# Patient Record
Sex: Female | Born: 1970 | Race: Black or African American | Hispanic: No | Smoking: Former smoker
Health system: Southern US, Community
[De-identification: ages and names within clinical notes are randomized; demographics above are authoritative.]

## PROBLEM LIST (undated history)

## (undated) DIAGNOSIS — IMO0002 Reserved for concepts with insufficient information to code with codable children: Secondary | ICD-10-CM

## (undated) DIAGNOSIS — I2699 Other pulmonary embolism without acute cor pulmonale: Secondary | ICD-10-CM

## (undated) DIAGNOSIS — M329 Systemic lupus erythematosus, unspecified: Secondary | ICD-10-CM

## (undated) HISTORY — PX: ECTOPIC PREGNANCY SURGERY: SHX613

---

## 2005-01-30 ENCOUNTER — Emergency Department (HOSPITAL_COMMUNITY): Admission: EM | Admit: 2005-01-30 | Discharge: 2005-01-30 | Payer: Self-pay | Admitting: Emergency Medicine

## 2005-01-31 ENCOUNTER — Ambulatory Visit: Payer: Self-pay | Admitting: Family Medicine

## 2005-08-08 ENCOUNTER — Emergency Department (HOSPITAL_COMMUNITY): Admission: EM | Admit: 2005-08-08 | Discharge: 2005-08-08 | Payer: Self-pay | Admitting: Emergency Medicine

## 2005-08-15 ENCOUNTER — Emergency Department (HOSPITAL_COMMUNITY): Admission: EM | Admit: 2005-08-15 | Discharge: 2005-08-15 | Payer: Self-pay | Admitting: Family Medicine

## 2008-09-22 ENCOUNTER — Inpatient Hospital Stay (HOSPITAL_COMMUNITY): Admission: EM | Admit: 2008-09-22 | Discharge: 2008-09-29 | Payer: Self-pay | Admitting: Emergency Medicine

## 2008-09-22 ENCOUNTER — Encounter (INDEPENDENT_AMBULATORY_CARE_PROVIDER_SITE_OTHER): Payer: Self-pay | Admitting: Internal Medicine

## 2008-09-22 ENCOUNTER — Ambulatory Visit: Payer: Self-pay | Admitting: Vascular Surgery

## 2010-10-20 IMAGING — CR DG CHEST 2V
2 series · 2 of 2 positions shown · non-contrast
Comparison: 01/30/2005.

CLINICAL DATA: 37-year-old female with chest pain more on the right
side.  Coughing up blood.

CHEST - 2 VIEW

[w chest pa]
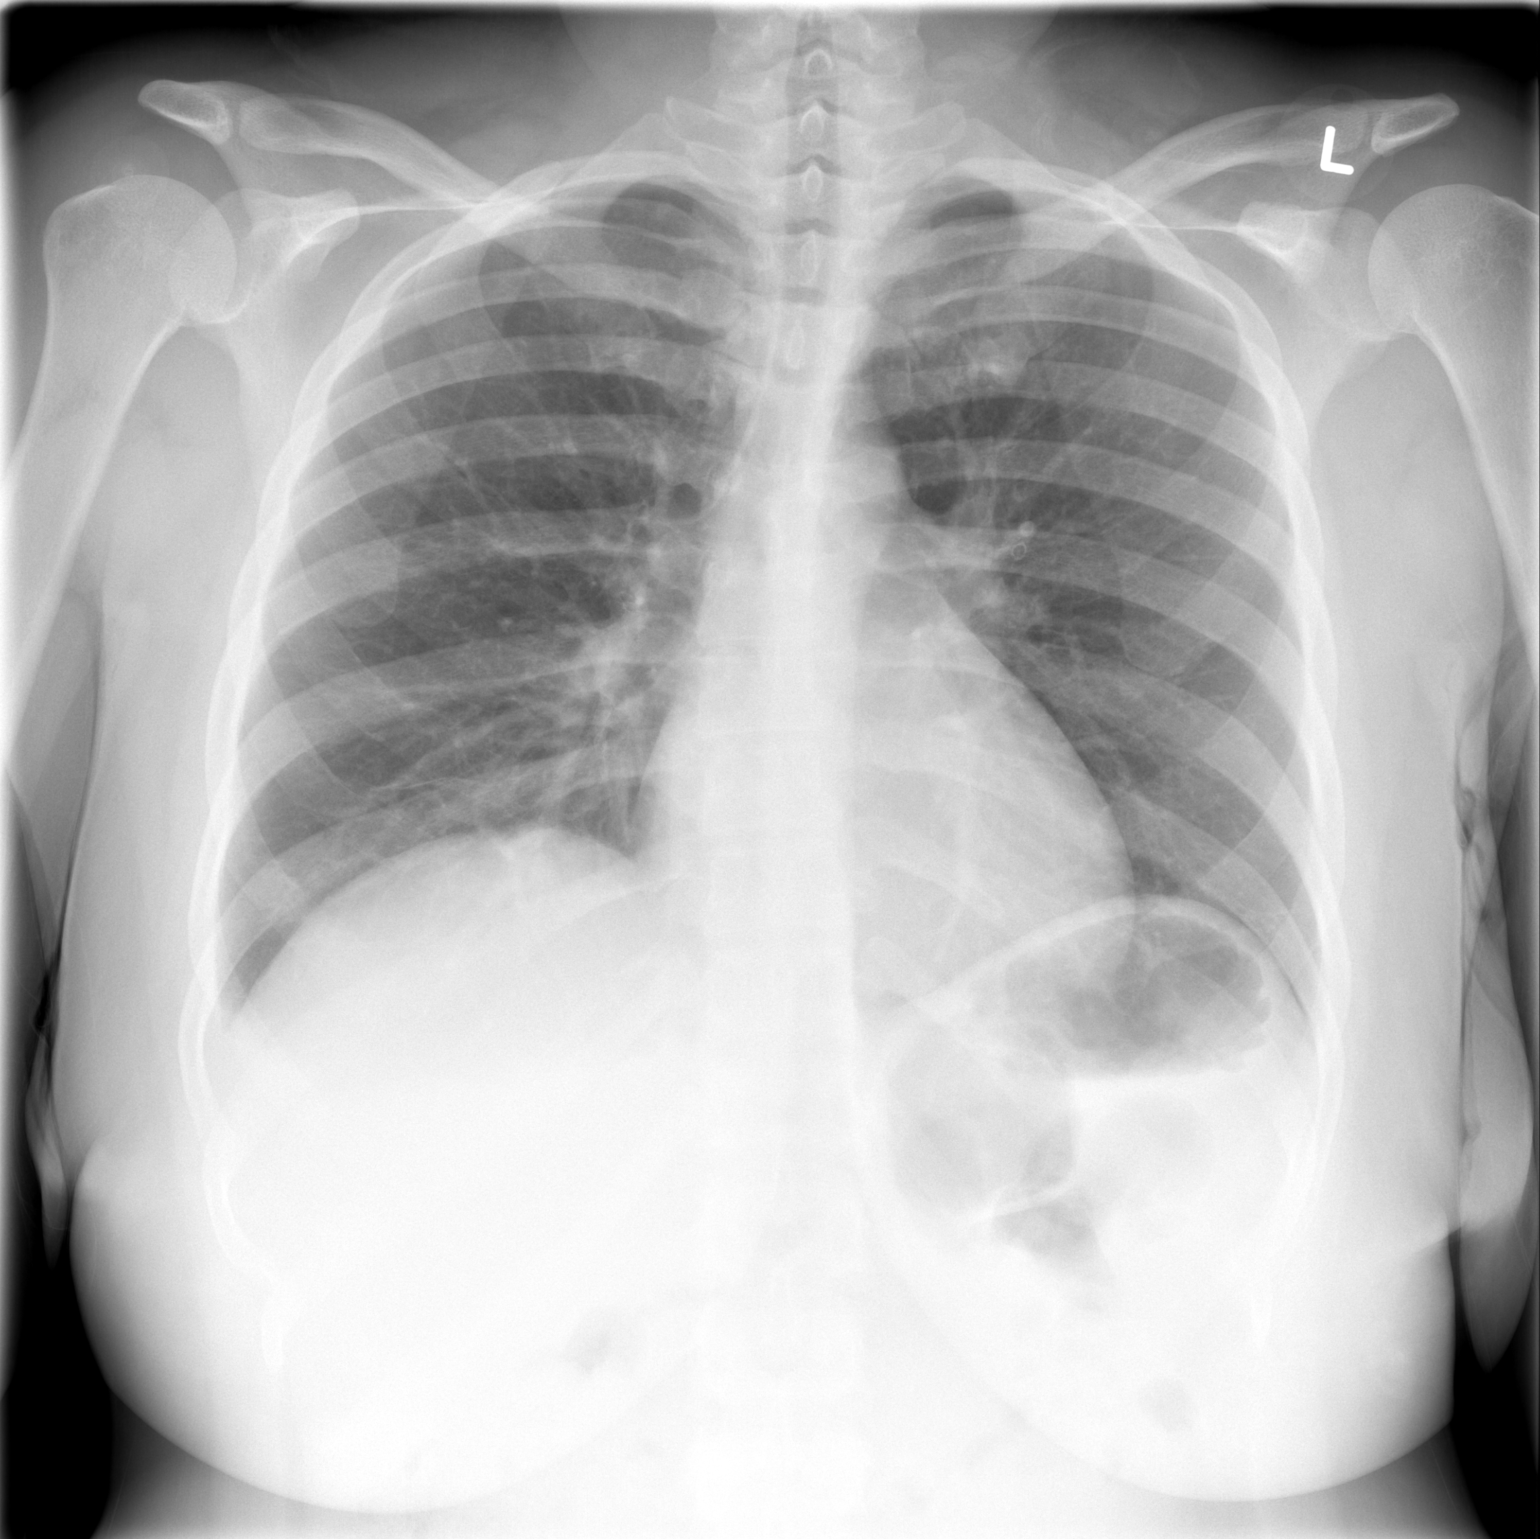

[w chest lat]
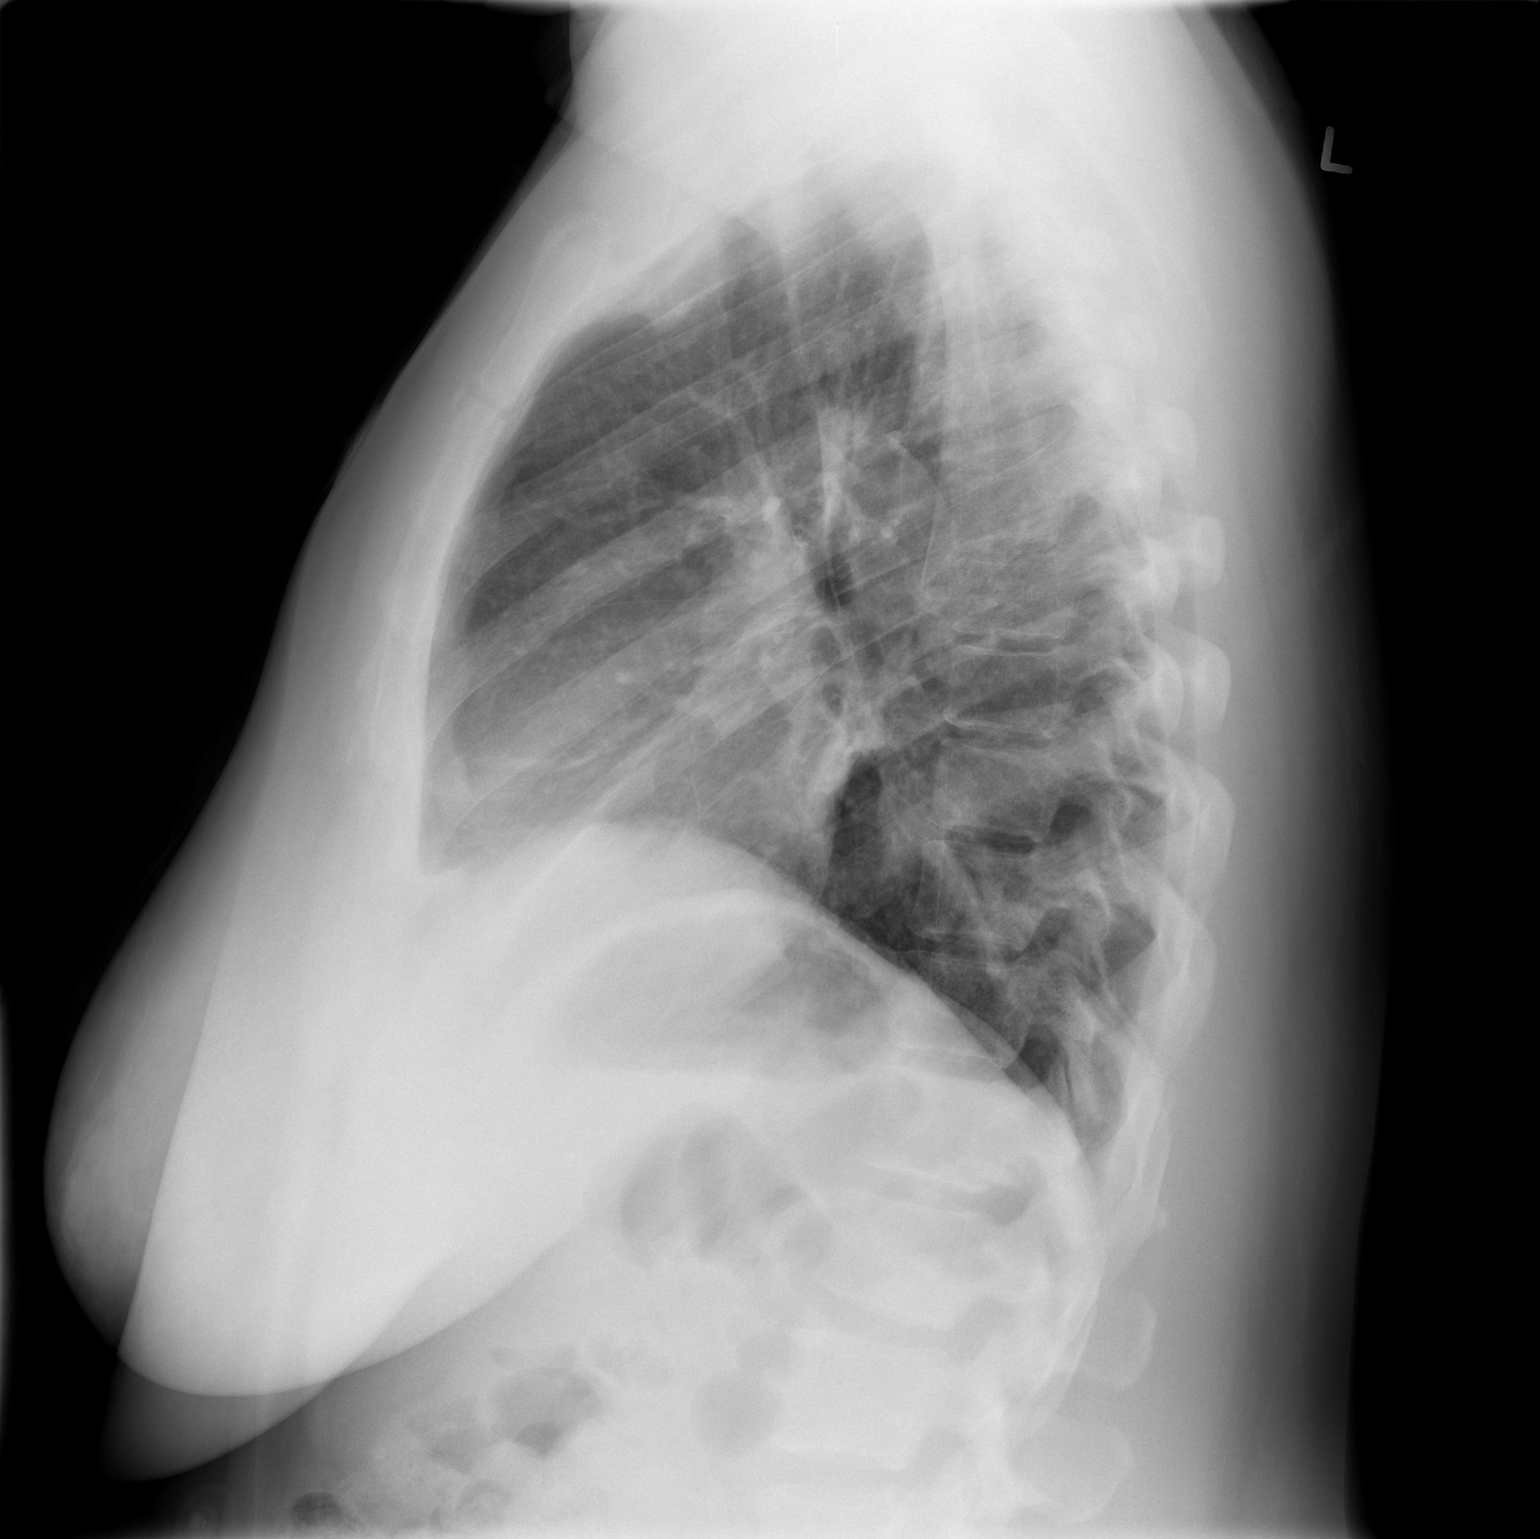

[2 of 2 positions shown; findings below may reference images not displayed]

FINDINGS: Chronic elevation of the right hemidiaphragm is
unchanged.  There is mild right middle lobe streaky opacity which
also to the degree of chronic but mildly increased since 3225.  The
left lung is clear.  Cardiac size and mediastinal contours are
within normal limits.  No pneumothorax, pulmonary edema, pleural
effusion or consolidation. Tracheal air column is within normal
limits.  No acute osseous abnormality identified.
IMPRESSION: Mild acute on chronic streaky right middle lobe opacity could
reflect atelectasis due to the chronic right hemidiaphragm
elevation.  Developing infection or other infiltrate are not
excluded.

## 2010-10-20 IMAGING — CT CT ANGIO CHEST
3 of 8 series · 18 of 36 positions shown · IV contrast (APPLIED)
Comparison: Chest radiograph from earlier the same day.

CLINICAL DATA: 37-year-old female with chest pain and left lower
extremity swelling.

CT ANGIOGRAPHY CHEST
TECHNIQUE: Multidetector CT imaging of the chest using the
standard protocol during bolus administration of intravenous
contrast. Multiplanar reconstructed images including MIPs were
obtained and reviewed to evaluate the vascular anatomy.
Contrast: 186 ml Omnipaque 350.

[Series 6: pulm embolism 3.0 b60f lung · axial · 0.72mm/px · z∈[-194,-86]mm · 3 of 73 slices shown]
[im 19/73  mediastinal]
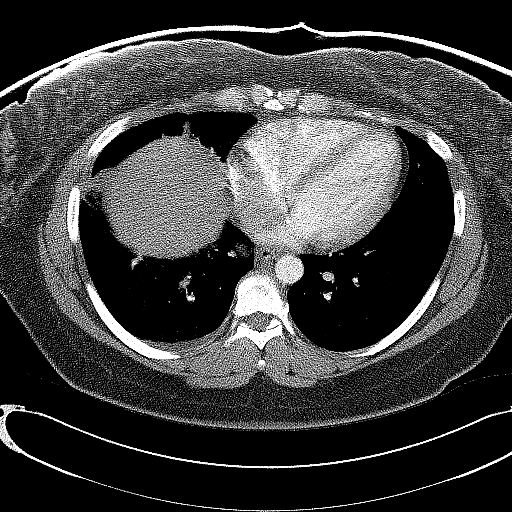
[im 37/73  mediastinal]
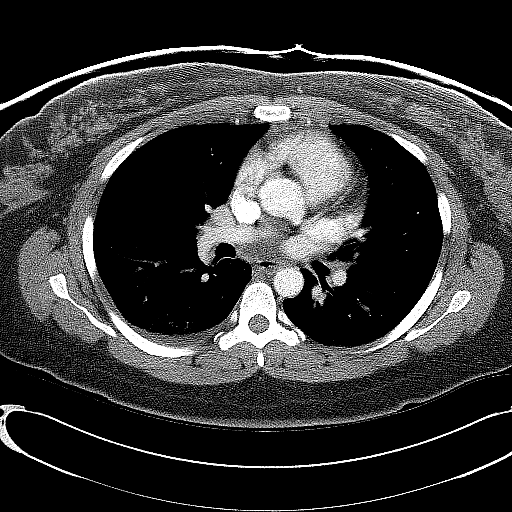
[im 55/73  mediastinal]
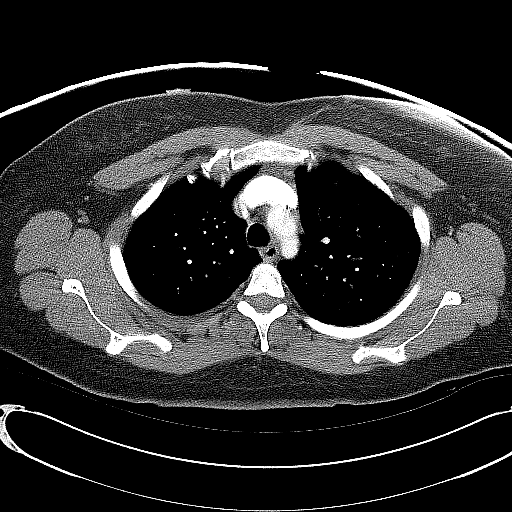

[Series 9: pulm embolism 1.0 b25f thins · axial · 0.72mm/px · z∈[-250,-48]mm · 14 of 233 slices shown]
[im 16/233  lung]
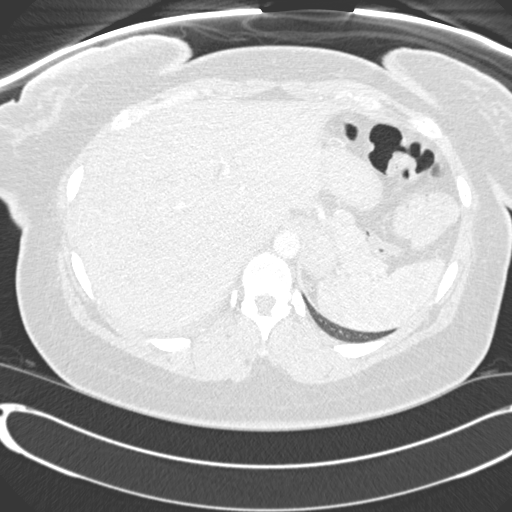
[im 31/233  mediastinal]
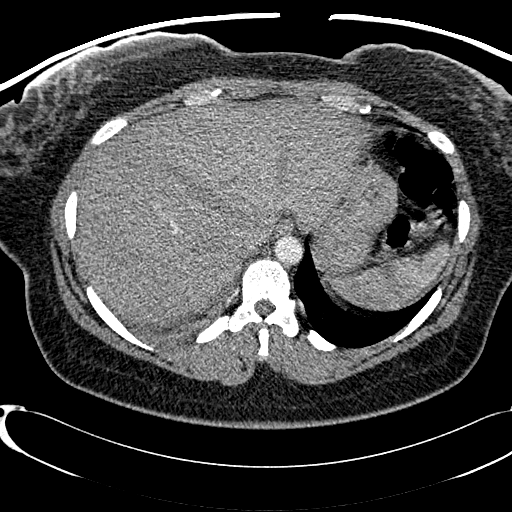
[im 47/233  lung]
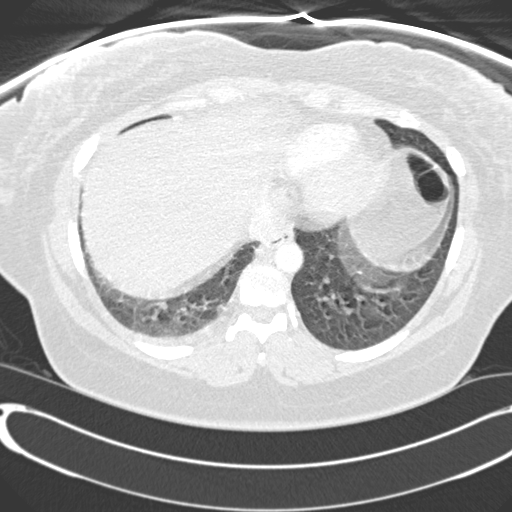
[im 62/233  mediastinal]
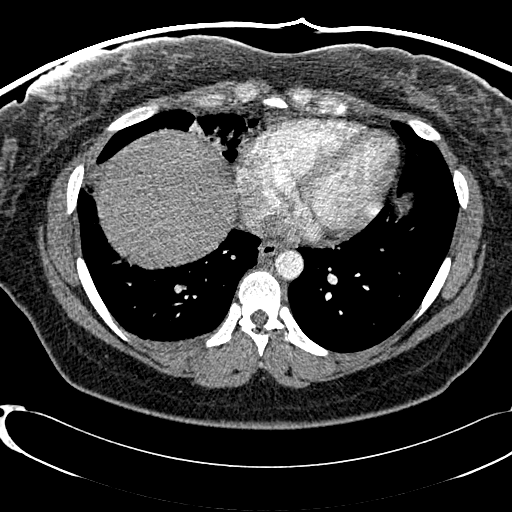
[im 78/233  lung]
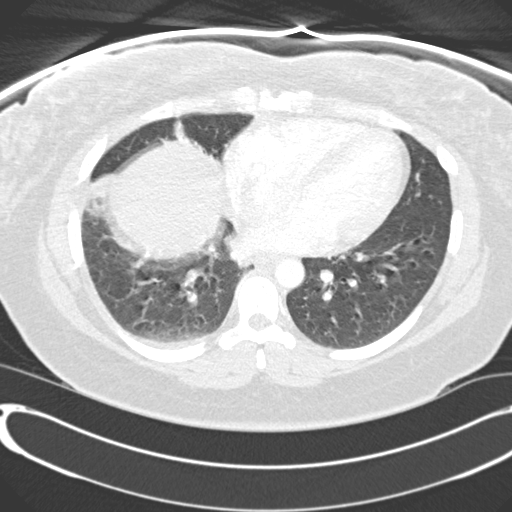
[im 93/233  mediastinal]
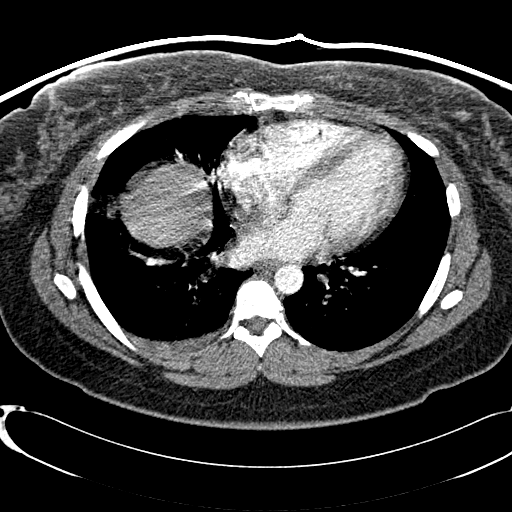
[im 109/233  lung]
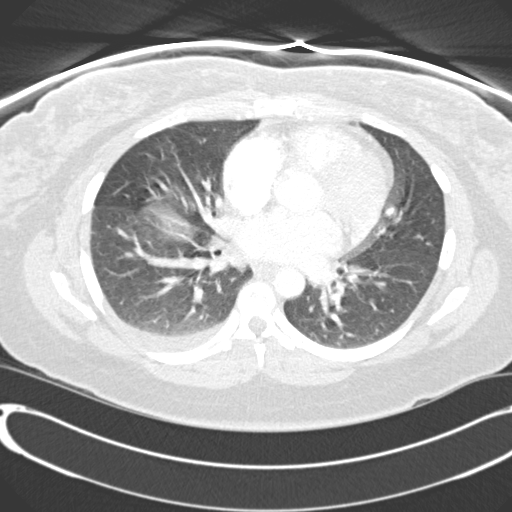
[im 124/233  mediastinal]
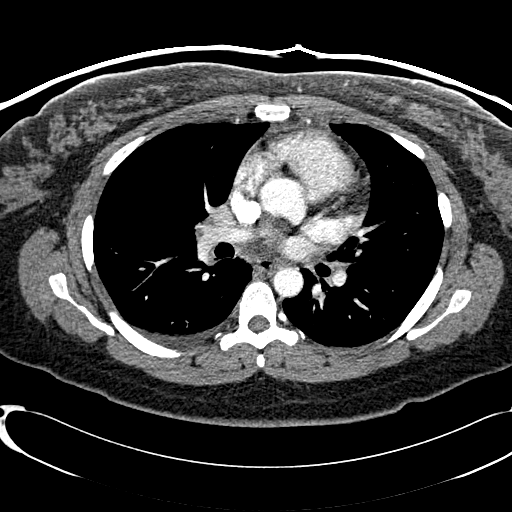
[im 140/233  lung]
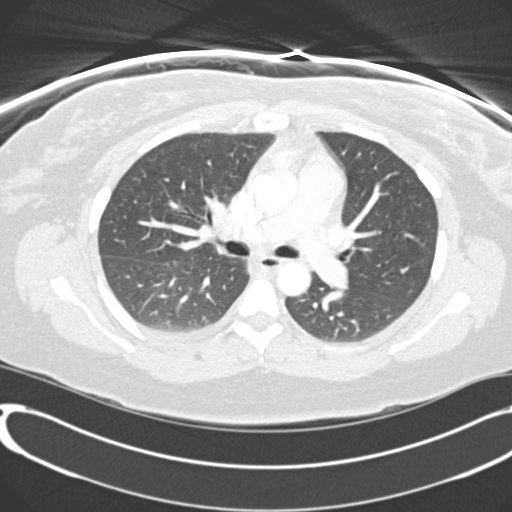
[im 155/233  mediastinal]
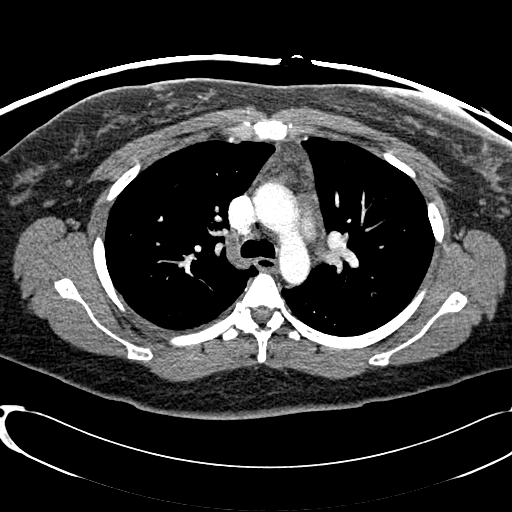
[im 171/233  lung]
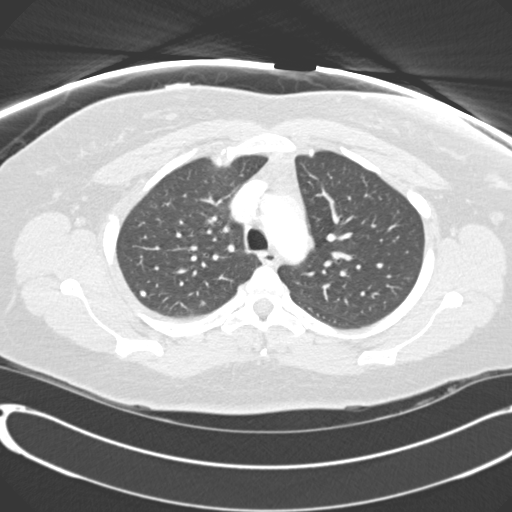
[im 186/233  mediastinal]
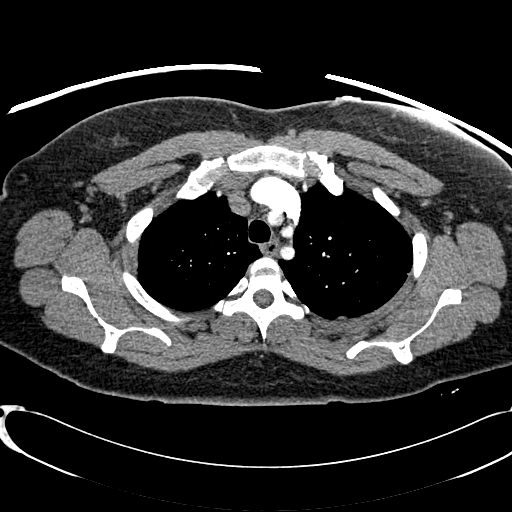
[im 202/233  lung]
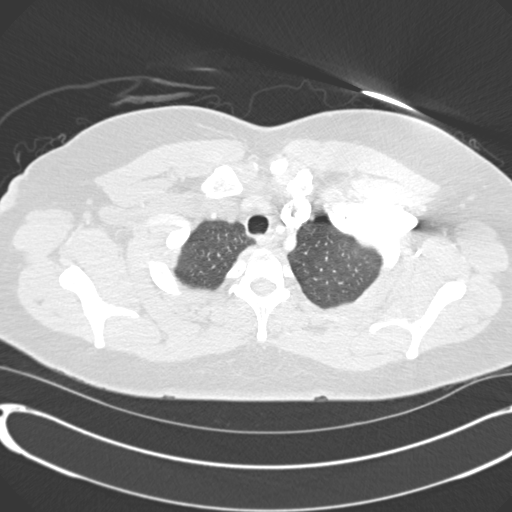
[im 217/233  mediastinal]
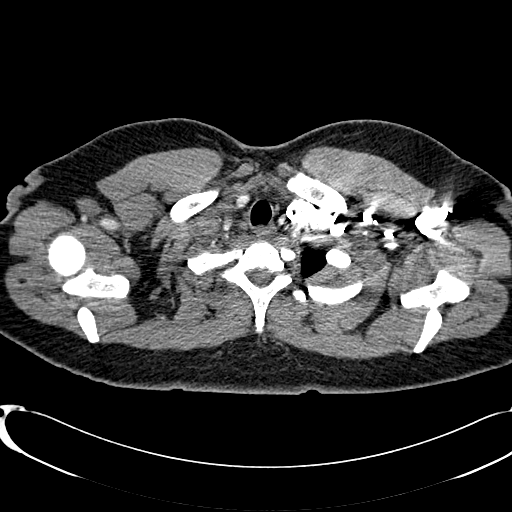

[Series 602: cor · coronal · 0.72mm/px · 1 of 108 slices shown]
[im 54/108  mediastinal]
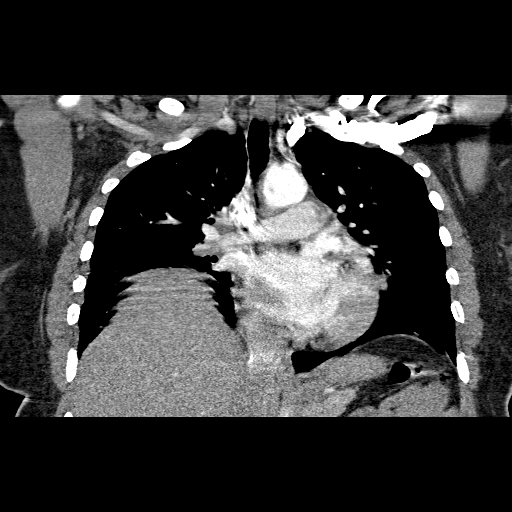

[18 of 36 positions shown; findings below may reference images not displayed]

FINDINGS: Adequate contrast bolus timing in the pulmonary arterial
tree.  Repeat scanning and contrast administration was necessary
following the initial due to poor contrast bolus timing which may
been related to inadequate intravenous access.  Additionally, the
initial scan also suffered from respiratory motion artifact at the
lung bases. There is a segment of the right lower lobe pulmonary
artery to the lateral basal segment which has suspicious low
density filling defects (series 8 images 148 and 152).  These areas
extend to a peripheral focus of ground-glass opacity seen on series
5 image 57 which is suspicious for associated pulmonary infarct in
this setting.  No other focal low density filling defect in the
pulmonary arteries is identified.

Small layering right pleural effusion no pericardial or left
pleural effusion.  Visualized aorta is normal.  Visualized thoracic
inlet is within normal limits.  Small bilateral hilar lymph nodes.
No mediastinal lymphadenopathy.  No axillary lymphadenopathy.

Visualized upper abdominal viscera are within normal limits.

Aside from the peripheral confluent ground-glass and airspace
opacity in the lateral basal segment of the right lower lobe
discussed above, there is right greater than left basilar
atelectasis.  There are also occasional calcified granulomas in the
right lung.  Major airways are patent.

No acute osseous abnormality identified.
IMPRESSION: 1.  Findings suspicious for small peripheral pulmonary emboli in
the lateral basal segment of the right lower lobe with subsequent
pulmonary infarct.  Alternatively, the appearance could reflect
early pneumonia (and vessel artifact). Clinical correlation
recommended.
2.  Small layering right pleural effusion.

## 2011-01-10 LAB — HEMATOCRIT: HCT: 35.4 % — ABNORMAL LOW (ref 36.0–46.0)

## 2011-01-10 LAB — PROTIME-INR
INR: 1.5 (ref 0.00–1.49)
INR: 1.7 — ABNORMAL HIGH (ref 0.00–1.49)
Prothrombin Time: 19 seconds — ABNORMAL HIGH (ref 11.6–15.2)
Prothrombin Time: 20.5 seconds — ABNORMAL HIGH (ref 11.6–15.2)
Prothrombin Time: 22.9 seconds — ABNORMAL HIGH (ref 11.6–15.2)
Prothrombin Time: 25.2 seconds — ABNORMAL HIGH (ref 11.6–15.2)

## 2011-02-08 NOTE — H&P (Signed)
NAME:  Lisa Edwards, MCMANIGAL NO.:  [id number]   MEDICAL RECORD NO.:  [id number]          PATIENT TYPE:  INP   LOCATION:  5127                         FACILITY:  [facility]   PHYSICIAN:  Lauralee Evener, MDDATE OF BIRTH:  1971/05/25   DATE OF ADMISSION:  09/21/2008  DATE OF DISCHARGE:                              HISTORY & PHYSICAL   CHIEF COMPLAINTS:  Left lower extremity swelling, chest pain, small  hemoptysis.   HISTORY OF PRESENT ILLNESS:  Lisa Edwards developed left lower  extremity pain and swelling 4 days ago.  She then developed a sudden  onset of chest pain 2 days ago.  The pain was worse with deep  inspiration.  She has had no fevers.  She did have a mild cough today  and had one small fleck of blood.  This prompted her visit to the  emergency department.  She has no personal or family history of blood  clots.  She did have two miscarriages.  She recently traveled to  [place] and back.  This is typical travel for her.  She has been more  sedentary recently.  She had no trauma to the leg, she has had no recent  surgery.  She is not on contraceptive pills.  She is a one-third pack  per day smoker.  Her weight has increased approximately 14 pounds over  the last few months.  She denies headache, blurry vision, neurologic  deficits.  She has no GI or GU complaints.   REVIEW OF SYSTEMS:  Comprehensive 10-point review of systems was  obtained as negative except as per HPI.   PAST MEDICAL HISTORY:  1. Pneumonia in 2007.  2. Two miscarriages and two ectopic pregnancies.  She has had one live      birth.   SOCIAL HISTORY:  She lives with a friend in [place].  She has smoked  one-third pack per day for 20 years.  She is not currently working.  She  has had multiple jobs in the past.  She uses marijuana but no other  drugs.   FAMILY HISTORY:  Mother with aneurysm.  Otherwise no known history of  clot in her extensive family.  Otherwise family history of  diabetes.   ALLERGIES:  PENICILLIN, LATEX.   MEDICATIONS:  None, notably no oral contraceptive pills.   PHYSICAL EXAM:  VITAL SIGNS:  Temperature 99, pulse 78, respiratory rate  16, blood pressure 119/76, O2 sat 98% on room air.  GENERAL:  Well-nourished, well-developed woman sitting comfortably in  the stretcher in no acute distress.  HEENT:  Sclerae anicteric, oropharynx clear.  NECK:  Supple.  HEART:  Regular with no murmurs.  LUNGS:  Clear to auscultation bilaterally.  ABDOMEN:  Soft, nontender, no masses palpated.  EXTREMITIES:  Warm with no edema.  NEURO:  No focal deficits.   DATA REVIEW:  1. White count 7.5, hemoglobin 12.9, platelets 155.  2. D-dimer is 1.22.  3. Chemistry within normal limits.  4. Pregnancy test negative.  5. CTA September 22, 2008:  Findings suspicious for small peripheral      pulmonary emboli in the lateral basal segment of  the right lower      lobe with subsequent pulmonary infarct.  Alternative could reflect      early pneumonia.  Clinical correlation is recommended.  Small      layering right pleural effusion.   IMPRESSION AND PLAN:  Ms. [name] has had acute peripheral  pulmonary embolism in the setting of a probable left lower extremity  deep venous thrombosis.  Her risk factors include smoking, being  overweight and recent sedentary lifestyle.  It is uncertain whether she  has an acquired or inherited coagulopathy.  Given her history of  miscarriages we will check her lupus anticoagulant panel as well as anti-  cardiolipin antibodies.  It is less likely that she would have a  prothrombin gene mutation or Leiden factor V.  These test could be done  in the future.  Protein C and S and antithrombin levels should not be  checked in the setting of the acute clot.  We will initiate Lovenox and  Coumadin per the pharmacy protocol.  We will have to coordinate  outpatient management of her anticoagulation as she has none at this  time.  She  will likely require 3 - 6 months of anticoagulation.  For  pain will use Tylenol and oxycodone.      Lauralee Evener, MD  Electronically Signed     WT/MEDQ  D:  09/22/2008  T:  09/22/2008  Job:  [id number]

## 2011-02-08 NOTE — Discharge Summary (Signed)
NAME:  Lisa Edwards, Lisa Edwards NO.:  [id number]   MEDICAL RECORD NO.:  [id number]          PATIENT TYPE:  INP   LOCATION:  5127                         FACILITY:  [facility]   PHYSICIAN:  Herbie Saxon, MDDATE OF BIRTH:  [birth year]   DATE OF ADMISSION:  09/21/2008  DATE OF DISCHARGE:  09/29/2008                               DISCHARGE SUMMARY   DISCHARGE DIAGNOSES:  1. Acute pulmonary embolism.  2. Morbid obesity.  3. Lupus anticoagulant antibody positive.  4. Tobacco abuse.   RADIOLOGY:  The CT angiogram of September 22, 2008, shows a small  peripheral pulmonary embolism in the lateral basal segment of the right  lower lobe with subsequent pulmonary infarct and a small layering right  pleural effusion.  Chest x-ray of September 22, 2008, shows mild acute on  chronic streaky right middle lobe opacity.   HOSPITAL COURSE:  This is a 40 year old African American female who  presented to the emergency room with complaint of left leg swelling,  chest pain, and hemoptysis.  Note, the patient also smokes one-third  pack per day.  She does live a sedentary lifestyle.  On presentation ct-  angiogram was indicative of pulmonary embolism.  The patient was started  on Lovenox with warfarin bridging.  The patient does not have any  primary care physician, and her INR was very slow to be optimized.  Hemoptysis, chest pain, and left leg swelling improved within 2-3 days  of admission.  A workup showed that she had positive lupus  anticoagulant.  The patient has been educated on  anticoagulation.  A  workup did show mild neutropenia; however, the patient remained  afebrile.  Also had mild albuminemia, likely nutritional.  INR is  optimal at 2.1 today.  The patient is anxious to be discharged home.  She has been set up to follow at the [facility] physician, in  [place] in the next 2-3 days.  They will monitor PT/INR at a goal of 2-  3.  She has been given warfarin diet and  complication education.  The  patient promises to comply with the Coumadin treatment.  Also to watch  out for bleeding.  Counseled extensively on need to cease tobacco  smoking.   DISCHARGE CONDITION:  Stable.   DIET:  Low cholesterol.   ACTIVITY:  Increase slowly as tolerated.   FOLLOWUP:  She is to follow up with the [facility] Clinic in [place] in  the next 2-3 days or any physician of her choice in conjunction with the  [facility].   MEDICATIONS ON DISCHARGE:  1. Warfarin 7.5 mg daily.  2. Tylenol 650 mg every 6 hours as needed.  3. Nicotine patch 14 mg per day.  4. Multivitamin 1 tablet daily.   PHYSICAL EXAMINATION:  GENERAL:  She is a young lady not in acute  distress.  Mildly pale.  Not jaundiced.  There is no cyanosis or  clubbing.  HEENT:  Head is atraumatic, normocephalic.  Mucous membranes are moist.  Oropharynx and nasopharynx are clear.  NECK:  Supple.  No thyromegaly or elevated JVD.  CHEST:  Clinically clear.  HEART:  Heart  sounds 1 and 2, regular rate and rhythm. No murmurs,  gallops, or rubs.  ABDOMEN:  Soft and nontender.  Bowel sounds are present.  NEUROLOGIC:  She is alert and oriented to time, place, and person.  CNS:  No deficits.  EXTREMITIES:  Peripheral pulses present.  No pedal edema.  No calf  tenderness.  Homans sign negative.   LABORATORY DATA:  Hematocrit is 33.8, PT is 25.2, INR is 2.1.  Cardiolipin antibody negative.  Chemistry:  Sodium is 139, potassium  3.5, chloride 106, bicarbonate 26, glucose 91, BUN 7, creatinine is 0.8.   DISCHARGE TIME:  Less than 30 minutes.      Herbie Saxon, MD  Electronically Signed     MIO/MEDQ  D:  09/29/2008  T:  09/30/2008  Job:  [id number]

## 2011-07-01 LAB — POCT I-STAT, CHEM 8
BUN: 10 mg/dL (ref 6–23)
Calcium, Ion: 1.18 mmol/L (ref 1.12–1.32)
Creatinine, Ser: 1.1 mg/dL (ref 0.4–1.2)
Glucose, Bld: 96 mg/dL (ref 70–99)
HCT: 38 % (ref 36.0–46.0)
Hemoglobin: 12.9 g/dL (ref 12.0–15.0)
Potassium: 3.6 mEq/L (ref 3.5–5.1)

## 2011-07-01 LAB — CBC
HCT: 36.9 % (ref 36.0–46.0)
MCHC: 33 g/dL (ref 30.0–36.0)
MCV: 92.5 fL (ref 78.0–100.0)
MCV: 92.9 fL (ref 78.0–100.0)
RBC: 3.89 MIL/uL (ref 3.87–5.11)
WBC: 3.5 10*3/uL — ABNORMAL LOW (ref 4.0–10.5)
WBC: 7.5 10*3/uL (ref 4.0–10.5)

## 2011-07-01 LAB — COMPREHENSIVE METABOLIC PANEL
ALT: 15 U/L (ref 0–35)
AST: 15 U/L (ref 0–37)
Albumin: 2.7 g/dL — ABNORMAL LOW (ref 3.5–5.2)
Alkaline Phosphatase: 58 U/L (ref 39–117)
BUN: 7 mg/dL (ref 6–23)
Calcium: 8.9 mg/dL (ref 8.4–10.5)
Chloride: 106 mEq/L (ref 96–112)
Creatinine, Ser: 0.86 mg/dL (ref 0.4–1.2)
GFR calc Af Amer: >60 mL/min (ref >60)
Glucose, Bld: 91 mg/dL (ref 70–99)

## 2011-07-01 LAB — PROTIME-INR
INR: 1.1 (ref 0.00–1.49)
INR: 1.2 (ref 0.00–1.49)
Prothrombin Time: 14.8 seconds (ref 11.6–15.2)

## 2011-07-01 LAB — DIFFERENTIAL
Basophils Absolute: 0.1 10*3/uL (ref 0.0–0.1)
Eosinophils Absolute: 0.1 10*3/uL (ref 0.0–0.7)
Eosinophils Relative: 2 % (ref 0–5)
Lymphocytes Relative: 19 % (ref 12–46)
Neutro Abs: 5.1 10*3/uL (ref 1.7–7.7)
Neutrophils Relative %: 69 % (ref 43–77)

## 2011-07-01 LAB — POCT PREGNANCY, URINE: Preg Test, Ur: NEGATIVE

## 2011-07-01 LAB — LUPUS ANTICOAGULANT PANEL
DRVVT: 50.6 secs — ABNORMAL HIGH (ref 36.1–47.0)
PTTLA 4:1 Mix: 66.4 secs — ABNORMAL HIGH (ref 36.3–48.8)
PTTLA Confirmation: 15.6 secs — ABNORMAL HIGH (ref <8.0)
dRVVT Incubated 1:1 Mix: 42.1 secs (ref 36.1–47.0)

## 2011-07-01 LAB — CARDIOLIPIN ANTIBODIES, IGG, IGM, IGA
Anticardiolipin IgA: <7 [APL'U] — ABNORMAL LOW (ref <13)
Anticardiolipin IgG: <7 [GPL'U] — ABNORMAL LOW (ref <11)

## 2013-02-27 ENCOUNTER — Telehealth: Payer: Self-pay | Admitting: Oncology

## 2013-02-27 NOTE — Telephone Encounter (Signed)
S/W PT IN RE NP APPT 07/08 @ 1:30 W/DR. SHADAD REFERRING KATHERINE PICKETT, PA-C DX- HX OF PE, ELEVATED LUPUS ANTICOAG WELCOME PACKET MAILED.

## 2013-02-28 ENCOUNTER — Telehealth: Payer: Self-pay | Admitting: Oncology

## 2013-02-28 NOTE — Telephone Encounter (Signed)
C/D 02/28/13 for appt. 04/02/13

## 2013-03-28 ENCOUNTER — Other Ambulatory Visit: Payer: Self-pay | Admitting: Oncology

## 2013-03-28 DIAGNOSIS — I82C19 Acute embolism and thrombosis of unspecified internal jugular vein: Secondary | ICD-10-CM

## 2013-04-02 ENCOUNTER — Ambulatory Visit: Payer: Self-pay | Admitting: Oncology

## 2013-04-02 ENCOUNTER — Ambulatory Visit: Payer: Self-pay

## 2013-04-02 ENCOUNTER — Other Ambulatory Visit: Payer: Self-pay | Admitting: Lab

## 2013-05-29 ENCOUNTER — Emergency Department (HOSPITAL_COMMUNITY)
Admission: EM | Admit: 2013-05-29 | Discharge: 2013-05-29 | Disposition: A | Discharge status: Home / Self Care | Payer: BC Managed Care – PPO | Attending: Emergency Medicine | Admitting: Emergency Medicine

## 2013-05-29 ENCOUNTER — Encounter (HOSPITAL_COMMUNITY): Payer: Self-pay | Admitting: *Deleted

## 2013-05-29 DIAGNOSIS — Z86711 Personal history of pulmonary embolism: Secondary | ICD-10-CM | POA: Insufficient documentation

## 2013-05-29 DIAGNOSIS — R109 Unspecified abdominal pain: Secondary | ICD-10-CM

## 2013-05-29 DIAGNOSIS — N760 Acute vaginitis: Secondary | ICD-10-CM | POA: Insufficient documentation

## 2013-05-29 DIAGNOSIS — R1110 Vomiting, unspecified: Secondary | ICD-10-CM | POA: Insufficient documentation

## 2013-05-29 DIAGNOSIS — Z3202 Encounter for pregnancy test, result negative: Secondary | ICD-10-CM | POA: Insufficient documentation

## 2013-05-29 DIAGNOSIS — A499 Bacterial infection, unspecified: Secondary | ICD-10-CM | POA: Insufficient documentation

## 2013-05-29 DIAGNOSIS — B9689 Other specified bacterial agents as the cause of diseases classified elsewhere: Secondary | ICD-10-CM

## 2013-05-29 DIAGNOSIS — Z8739 Personal history of other diseases of the musculoskeletal system and connective tissue: Secondary | ICD-10-CM | POA: Insufficient documentation

## 2013-05-29 DIAGNOSIS — N889 Noninflammatory disorder of cervix uteri, unspecified: Secondary | ICD-10-CM

## 2013-05-29 DIAGNOSIS — Z880 Allergy status to penicillin: Secondary | ICD-10-CM | POA: Insufficient documentation

## 2013-05-29 HISTORY — DX: Other pulmonary embolism without acute cor pulmonale: I26.99

## 2013-05-29 HISTORY — DX: Systemic lupus erythematosus, unspecified: M32.9

## 2013-05-29 HISTORY — DX: Reserved for concepts with insufficient information to code with codable children: IMO0002

## 2013-05-29 LAB — WET PREP, GENITAL: Yeast Wet Prep HPF POC: NONE SEEN

## 2013-05-29 LAB — URINE MICROSCOPIC-ADD ON

## 2013-05-29 LAB — URINALYSIS, ROUTINE W REFLEX MICROSCOPIC
Bilirubin Urine: NEGATIVE
Ketones, ur: NEGATIVE mg/dL
Specific Gravity, Urine: 1.024 (ref 1.005–1.030)
Urobilinogen, UA: 2 mg/dL — ABNORMAL HIGH (ref 0.0–1.0)

## 2013-05-29 MED ORDER — METRONIDAZOLE 500 MG PO TABS: 500.0000 mg | ORAL_TABLET | Freq: Two times a day (BID) | ORAL | Status: AC

## 2013-05-29 NOTE — ED Notes (Signed)
Pt is here with lower abdominal pain since last nite and having lite period spotting since Friday.  July menstrual was not normal and thinks could be pregnant.  Pt has history of ectopic pregnancy times 2

## 2013-05-29 NOTE — ED Provider Notes (Signed)
CSN: [id number]     Arrival date & time 05/29/13  1400 History   First MD Initiated Contact with Patient 05/29/13 1618     Chief Complaint  Patient presents with  . Abdominal Pain    HPI  Lisa Edwards is a 42 y.o. female with a PMH of lupus and PE who presents to the ED for evaluation of abdominal pain.  Patient states that she developed some lower middle abdominal/pelvic pain without radiation last night.  Her pain comes and goes is described as a burning sensation. Nothing makes her pain better or worse. She not take anything for pain. She has not had similar symptoms in the past. She states she has a history of ectopic pregnancy.  She states that her last normal menstrual period was July 24 and she developed vaginal spotting a few days ago.  She states she is worried that she is pregnant since she has not had a normal menstrual period.  She states she has also been nauseated for the past month. She had one episode of emesis a few weeks ago.  She states that she's had diarrhea off and on as well.  No hematochezia.  No vaginal discharge, genital sores, dysuria, or hematuria.  She denies any new sexual partners.  No fever, chills, change in appetite/activity, rhinorrhea, congestion, sore throat, chest pain, SOB, weakness, headache, dizziness, or lightheadedness.    Past Medical History  Diagnosis Date  . Lupus   . Pulmonary embolism    Past Surgical History  Procedure Laterality Date  . Ectopic pregnancy surgery     No family history on file. History  Substance Use Topics  . Smoking status: Not on file  . Smokeless tobacco: Not on file  . Alcohol Use: Not on file   OB History   Grav Para Term Preterm Abortions TAB SAB Ect Mult Living                 Review of Systems  Constitutional: Negative for fever, chills, activity change, appetite change and fatigue.  HENT: Negative for congestion, sore throat, rhinorrhea, neck pain and neck stiffness.   Eyes: Negative for photophobia  and visual disturbance.  Respiratory: Negative for cough, shortness of breath and wheezing.   Cardiovascular: Negative for chest pain and leg swelling.  Gastrointestinal: Positive for vomiting (See HPI ) and abdominal pain. Negative for nausea, diarrhea, constipation, blood in stool and abdominal distention.  Genitourinary: Positive for vaginal bleeding and pelvic pain. Negative for dysuria, frequency, hematuria, flank pain, vaginal discharge, difficulty urinating and vaginal pain.  Musculoskeletal: Negative for back pain.  Skin: Negative for wound.  Neurological: Negative for dizziness, syncope, weakness, light-headedness, numbness and headaches.  Psychiatric/Behavioral: Negative for confusion.    Allergies  Penicillins  Home Medications  No current outpatient prescriptions on file. BP 141/87  Pulse 64  Temp(Src) 98.1 F (36.7 C) (Oral)  Resp 18  SpO2 98%  Filed Vitals:   05/29/13 1830 05/29/13 1845 05/29/13 1900 05/29/13 1915  BP: 135/79 150/85 158/86 151/91  Pulse: 60 62 64 57  Temp:      TempSrc:      Resp:      SpO2: 100% 100% 100% 100%    Physical Exam  Nursing note and vitals reviewed. Constitutional: She is oriented to person, place, and time. She appears well-developed and well-nourished. No distress.  HENT:  Head: Normocephalic and atraumatic.  Right Ear: External ear normal.  Left Ear: External ear normal.  Nose: Nose normal.  Mouth/Throat: Oropharynx is clear and moist. No oropharyngeal exudate.  Eyes: Conjunctivae are normal. Pupils are equal, round, and reactive to light. Right eye exhibits no discharge. Left eye exhibits no discharge.  Neck: Normal range of motion. Neck supple.  Cardiovascular: Normal rate, regular rhythm, normal heart sounds and intact distal pulses.  Exam reveals no gallop and no friction rub.   No murmur heard. Pulmonary/Chest: Effort normal and breath sounds normal. No respiratory distress. She has no wheezes. She has no rales. She  exhibits no tenderness.  Abdominal: Soft. Bowel sounds are normal. She exhibits no distension and no mass. There is no tenderness. There is no rebound and no guarding.  Genitourinary:    Mild amount of thin white/bloody discharge present in the vaginal vault.  No CMT or adnexal tenderness bilaterally.  Small 3 mm x 3 mm fixed white raised lesion on the cervix at the 6:00 position which is non-tender to palpation.      Musculoskeletal: Normal range of motion. She exhibits no edema and no tenderness.  No CVA tenderness bilaterally  Neurological: She is alert and oriented to person, place, and time.  Skin: Skin is warm and dry. She is not diaphoretic.    ED Course  Procedures (including critical care time) Labs Review Labs Reviewed  URINALYSIS, ROUTINE W REFLEX MICROSCOPIC   Imaging Review No results found.  Results for orders placed during the hospital encounter of 05/29/13  WET PREP, GENITAL      Result Value Range   Yeast Wet Prep HPF POC NONE SEEN  NONE SEEN   Trich, Wet Prep NONE SEEN  NONE SEEN   Clue Cells Wet Prep HPF POC FEW (*) NONE SEEN   WBC, Wet Prep HPF POC MODERATE (*) NONE SEEN  GC/CHLAMYDIA PROBE AMP      Result Value Range   CT Probe RNA NEGATIVE  NEGATIVE   GC Probe RNA NEGATIVE  NEGATIVE  URINALYSIS, ROUTINE W REFLEX MICROSCOPIC      Result Value Range   Color, Urine YELLOW  YELLOW   APPearance CLEAR  CLEAR   Specific Gravity, Urine 1.024  1.005 - 1.030   pH 6.5  5.0 - 8.0   Glucose, UA NEGATIVE  NEGATIVE mg/dL   Hgb urine dipstick TRACE (*) NEGATIVE   Bilirubin Urine NEGATIVE  NEGATIVE   Ketones, ur NEGATIVE  NEGATIVE mg/dL   Protein, ur NEGATIVE  NEGATIVE mg/dL   Urobilinogen, UA 2.0 (*) 0.0 - 1.0 mg/dL   Nitrite NEGATIVE  NEGATIVE   Leukocytes, UA SMALL (*) NEGATIVE  URINE MICROSCOPIC-ADD ON      Result Value Range   Squamous Epithelial / LPF FEW (*) RARE   WBC, UA 0-2  <3 WBC/hpf   RBC / HPF 0-2  <3 RBC/hpf   Bacteria, UA FEW (*) RARE    Urine-Other MUCOUS PRESENT    POCT PREGNANCY, URINE      Result Value Range   Preg Test, Ur NEGATIVE  NEGATIVE     MDM   1. Bacterial vaginosis   2. Abdominal pain   3. Abnormal cervix finding     Lisa Edwards is a 42 y.o. female with a PMH of lupus and PE who presents to the ED for evaluation of abdominal pain.  UA and urine pregnancy ordered to further evaluate.    Rechecks  5:35 PM = Patient sitting talking with her friend and is in no acute distress.  States she feels fine  6:48 PM = Pelvic exam performed  with ER tech present.  Wet mount and CG swabs sent to lab.    Etiology of middle lower abdominal/pelvic pain possible due to bacterial vaginosis vs. menstrual period.  Patient's abdominal exam was benign.  Patient remained in no acute distress throughout her ED visit.  Urine showed trace leukocytes and will be sent for culture. Gonorrhea and chlamydia swabs sent as well. She was prescribed flagyl and instructed not to drink alcohol while taking this medication.  She was instructed to follow-up with a OB/GYN this week regarding her pelvic pain and cervical lesion.  She was also instructed to follow-up regarding her elevated BP and if her abdominal pain does not improve in 2 days.  She was instructed to return to the ED if she experiences any repeated emesis, worsening/change in abdominal pain, fever, blood in her stool/emesis, or other concerns.  She was in agreement with discharge and plan.     Final impressions: 1. Bacterial vaginosis  2. Abdominal pain 3. Abnormal cervix finding  4. Elevated BP    Luiz Iron PA-C   This patient was discussed with Dr. Nonie Hoyer, PA-C 05/30/13 [phone]

## 2013-05-31 LAB — URINE CULTURE: Colony Count: NO GROWTH

## 2013-05-31 NOTE — ED Provider Notes (Signed)
Medical screening examination/treatment/procedure(s) were performed by non-physician practitioner and as supervising physician I was immediately available for consultation/collaboration.  Ethelda Chick, MD 05/31/13 1728

## 2013-06-10 ENCOUNTER — Ambulatory Visit (INDEPENDENT_AMBULATORY_CARE_PROVIDER_SITE_OTHER): Payer: BC Managed Care – PPO | Admitting: Medical

## 2013-06-10 ENCOUNTER — Encounter: Payer: Self-pay | Admitting: Medical

## 2013-06-10 VITALS — BP 133/90 | HR 67 | Temp 97.2°F | Ht 65.0 in | Wt 278.5 lb

## 2013-06-10 DIAGNOSIS — Z01419 Encounter for gynecological examination (general) (routine) without abnormal findings: Secondary | ICD-10-CM

## 2013-06-10 DIAGNOSIS — Z1231 Encounter for screening mammogram for malignant neoplasm of breast: Secondary | ICD-10-CM

## 2013-06-10 NOTE — Progress Notes (Signed)
Patient ID: Lisa Edwards, female   DOB: 03-Nov-1970, 42 y.o.   MRN: [id number]  History:  Ms. Lisa Edwards is a 42 y.o. [phone] who presents to clinic today for ED follow-up for ? Mass on her cervix. LMP 04/18/13 and then spotting on 05/26/13 x 1 week. Patient denies history of irregular periods. Patient is currently sexually active. Patient denies any birth control or condom use. Last intercourse was early July. Patient has had 2 ectopic pregnancies and had one tube removed and states the other has "scar tissue." Patient is unsure of when last pap smear was performed. It has been years. Patient was diagnosed with BV in [facility] and treated with Flagyl. She denies vaginal discharge, bleeding or odor today. Patient states that she has started having some occasional hot flashes recently as well.   The following portions of the patient's history were reviewed and updated as appropriate: allergies, current medications, past family history, past medical history, past social history, past surgical history and problem list.  Review of Systems:  Pertinent items are noted in HPI.  Objective:  Physical Exam BP 133/90  Pulse 67  Temp(Src) 97.2 F (36.2 C) (Oral)  Ht 5\' 5"  (1.651 m)  Wt 278 lb 8 oz (126.327 kg)  BMI 46.34 kg/m2  LMP 05/26/2013 GENERAL: Well-developed, well-nourished female in no acute distress.  HEENT: Normocephalic, atraumatic.  NECK: Supple. Normal thyroid.  LUNGS: Normal rate. Clear to auscultation bilaterally.  HEART: Regular rate and rhythm with no adventitious sounds.  BREASTS: Symmetric in size. No masses, skin changes, nipple drainage, or lymphadenopathy. ABDOMEN: Soft, nontender, nondistended. No organomegaly. Normal bowel sounds appreciated in all quadrants.  PELVIC: Normal external female genitalia. Vagina is pink and rugated.  Normal discharge. Normal cervix contour, small area of friability noted at 6 o'clock. Pap smear obtained. Small amount of bleeding following  the pap smear at the cervical os. Uterus is normal in size. No adnexal mass or tenderness.  EXTREMITIES: No cyanosis, clubbing, or edema, 2+ distal pulses.  Labs and Imaging Pap smear obtained today UPT - negative  Assessment & Plan:  Assessment: Normal female exam Perimenopause  Plans: 1. UPT today is negative 2. Pap smear obtained. Patient will receive a letter for normal results or be contacted from [facility] clinic with results requiring follow-up 3. Patient to return to [facility] clinic in ~ 1 year for annual exam or sooner as needed  Freddi Starr, Cordelia Poche 06/10/2013 3:19 PM

## 2013-06-10 NOTE — Patient Instructions (Signed)
Pap Test A Pap test checks the cells on the surface of your cervix. Your doctor will look for cell changes that are not normal, an infection, or cancer. If the cells no longer look normal, it is called dysplasia. Dysplasia can turn into cancer. Regular Pap tests are important to stop cancer from developing. BEFORE THE PROCEDURE  Ask your doctor when to schedule your Pap test. Timing the test around your period may be important.  Do not douche or have sex (intercourse) for 24 hours before the test.  Do not put creams on your vagina or use tampons for 24 hours before the test.  Go pee (urinate) just before the test. PROCEDURE  You will lie on an exam table with your feet in stirrups.  A warm metal or plastic tool (speculum) will be put in your vagina to open it up.  Your doctor will use a small, plastic brush or wooden spatula to take cells from your cervix.  The cells will be put in a lab container.  The cells will be checked under a microscope to see if they are normal or not. AFTER THE PROCEDURE Get your test results. If they are abnormal, you may need more tests. Document Released: 10/15/2010 Document Revised: 12/05/2011 Document Reviewed: 09/08/2011 [employer] Patient Information 2014 [employer].  

## 2013-06-17 ENCOUNTER — Ambulatory Visit (HOSPITAL_COMMUNITY): Payer: BC Managed Care – PPO | Attending: Medical

## 2014-07-28 ENCOUNTER — Encounter: Payer: Self-pay | Admitting: Medical

## 2014-08-11 ENCOUNTER — Telehealth: Payer: Self-pay | Admitting: *Deleted

## 2014-08-11 NOTE — Telephone Encounter (Addendum)
-----   Message from Marny LowensteinJulie N Wenzel, PA-C sent at 08/11/2014  1:21 PM EST ----- Can we please contact this patient to see if she needs a mammogram? Order was placed a year ago and she was never scheduled.   Thanks,  Raynelle FanningJulie ----- Message -----    From: SYSTEM    Sent: 08/10/2014  12:05 AM      To: Marny LowensteinJulie N Wenzel, PA-C   Called pt and was unable to leave message due to no voice mail.  *Note: pt does not have insurance and may wish to apply for Mammogram Scholarship.  Diane Day [facility] 11/18  1342  Called pt again and still unable to leave message. Diane Day [facility] 11/24  1317  Called pt and unable to leave message due to no voice mail.  Letter sent to pt. Diane Day [facility] 11/24  1330  Pt has no address on file - cannot send letter. Diane Day [facility]

## 2014-08-19 ENCOUNTER — Encounter: Payer: Self-pay | Admitting: *Deleted
